# Patient Record
Sex: Female | Born: 1960 | Race: White | Hispanic: No | Marital: Married
Health system: Southern US, Community
[De-identification: ages and names within clinical notes are randomized; demographics above are authoritative.]

## PROBLEM LIST (undated history)

## (undated) HISTORY — PX: TONSILLECTOMY AND ADENOIDECTOMY: SHX28

---

## 1998-07-10 ENCOUNTER — Ambulatory Visit (HOSPITAL_COMMUNITY): Admission: RE | Admit: 1998-07-10 | Discharge: 1998-07-10 | Payer: Self-pay | Admitting: Obstetrics and Gynecology

## 1998-07-10 ENCOUNTER — Encounter: Payer: Self-pay | Admitting: Obstetrics and Gynecology

## 1999-10-02 ENCOUNTER — Encounter: Payer: Self-pay | Admitting: Emergency Medicine

## 1999-10-02 ENCOUNTER — Emergency Department (HOSPITAL_COMMUNITY): Admission: EM | Admit: 1999-10-02 | Discharge: 1999-10-02 | Payer: Self-pay | Admitting: Emergency Medicine

## 2000-01-22 ENCOUNTER — Encounter: Payer: Self-pay | Admitting: Obstetrics and Gynecology

## 2000-01-22 ENCOUNTER — Ambulatory Visit (HOSPITAL_COMMUNITY): Admission: RE | Admit: 2000-01-22 | Discharge: 2000-01-22 | Payer: Self-pay | Admitting: Obstetrics and Gynecology

## 2001-02-11 ENCOUNTER — Encounter: Payer: Self-pay | Admitting: Obstetrics and Gynecology

## 2001-02-11 ENCOUNTER — Ambulatory Visit (HOSPITAL_COMMUNITY): Admission: RE | Admit: 2001-02-11 | Discharge: 2001-02-11 | Payer: Self-pay | Admitting: Obstetrics and Gynecology

## 2001-05-03 ENCOUNTER — Other Ambulatory Visit: Admission: RE | Admit: 2001-05-03 | Discharge: 2001-05-03 | Payer: Self-pay | Admitting: *Deleted

## 2002-02-20 ENCOUNTER — Encounter: Payer: Self-pay | Admitting: Obstetrics and Gynecology

## 2002-02-20 ENCOUNTER — Ambulatory Visit (HOSPITAL_COMMUNITY): Admission: RE | Admit: 2002-02-20 | Discharge: 2002-02-20 | Payer: Self-pay | Admitting: Obstetrics and Gynecology

## 2003-01-24 ENCOUNTER — Emergency Department (HOSPITAL_COMMUNITY): Admission: EM | Admit: 2003-01-24 | Discharge: 2003-01-24 | Payer: Self-pay | Admitting: Emergency Medicine

## 2003-01-24 ENCOUNTER — Encounter: Payer: Self-pay | Admitting: Emergency Medicine

## 2003-03-24 ENCOUNTER — Ambulatory Visit (HOSPITAL_COMMUNITY): Admission: RE | Admit: 2003-03-24 | Discharge: 2003-03-24 | Payer: Self-pay | Admitting: Orthopedic Surgery

## 2003-03-24 ENCOUNTER — Encounter: Payer: Self-pay | Admitting: Orthopedic Surgery

## 2004-07-15 ENCOUNTER — Other Ambulatory Visit: Admission: RE | Admit: 2004-07-15 | Discharge: 2004-07-15 | Payer: Self-pay | Admitting: Obstetrics and Gynecology

## 2004-07-16 ENCOUNTER — Ambulatory Visit (HOSPITAL_COMMUNITY): Admission: RE | Admit: 2004-07-16 | Discharge: 2004-07-16 | Payer: Self-pay | Admitting: Obstetrics and Gynecology

## 2004-10-10 ENCOUNTER — Ambulatory Visit (HOSPITAL_COMMUNITY): Admission: RE | Admit: 2004-10-10 | Discharge: 2004-10-10 | Payer: Self-pay | Admitting: Obstetrics and Gynecology

## 2005-10-28 ENCOUNTER — Ambulatory Visit (HOSPITAL_COMMUNITY): Admission: RE | Admit: 2005-10-28 | Discharge: 2005-10-28 | Payer: Self-pay | Admitting: Internal Medicine

## 2006-12-22 ENCOUNTER — Ambulatory Visit (HOSPITAL_COMMUNITY): Admission: RE | Admit: 2006-12-22 | Discharge: 2006-12-22 | Payer: Self-pay | Admitting: Obstetrics and Gynecology

## 2008-02-08 ENCOUNTER — Ambulatory Visit (HOSPITAL_COMMUNITY): Admission: RE | Admit: 2008-02-08 | Discharge: 2008-02-08 | Payer: Self-pay | Admitting: Internal Medicine

## 2009-05-10 ENCOUNTER — Ambulatory Visit (HOSPITAL_COMMUNITY): Admission: RE | Admit: 2009-05-10 | Discharge: 2009-05-10 | Payer: Self-pay | Admitting: Obstetrics and Gynecology

## 2010-01-01 ENCOUNTER — Emergency Department (HOSPITAL_BASED_OUTPATIENT_CLINIC_OR_DEPARTMENT_OTHER): Admission: EM | Admit: 2010-01-01 | Discharge: 2010-01-01 | Payer: Self-pay | Admitting: Emergency Medicine

## 2010-01-24 ENCOUNTER — Encounter: Admission: RE | Admit: 2010-01-24 | Discharge: 2010-03-28 | Payer: Self-pay | Admitting: Orthopedic Surgery

## 2010-09-27 ENCOUNTER — Encounter: Payer: Self-pay | Admitting: Internal Medicine

## 2010-09-28 ENCOUNTER — Encounter: Payer: Self-pay | Admitting: Obstetrics and Gynecology

## 2010-10-31 ENCOUNTER — Other Ambulatory Visit (HOSPITAL_COMMUNITY): Payer: Self-pay | Admitting: Obstetrics and Gynecology

## 2010-10-31 DIAGNOSIS — Z1231 Encounter for screening mammogram for malignant neoplasm of breast: Secondary | ICD-10-CM

## 2010-11-07 ENCOUNTER — Ambulatory Visit (HOSPITAL_COMMUNITY)
Admission: RE | Admit: 2010-11-07 | Discharge: 2010-11-07 | Disposition: A | Discharge status: Home / Self Care | Payer: 59 | Source: Ambulatory Visit | Attending: Obstetrics and Gynecology | Admitting: Obstetrics and Gynecology

## 2010-11-07 DIAGNOSIS — Z1231 Encounter for screening mammogram for malignant neoplasm of breast: Secondary | ICD-10-CM | POA: Insufficient documentation

## 2010-11-25 LAB — URINALYSIS, ROUTINE W REFLEX MICROSCOPIC
Bilirubin Urine: NEGATIVE
Ketones, ur: NEGATIVE mg/dL
Nitrite: NEGATIVE
Protein, ur: NEGATIVE mg/dL
Urobilinogen, UA: 0.2 mg/dL (ref 0.0–1.0)
pH: 6 (ref 5.0–8.0)

## 2011-04-01 ENCOUNTER — Ambulatory Visit (HOSPITAL_COMMUNITY)
Admission: RE | Admit: 2011-04-01 | Discharge: 2011-04-01 | Disposition: A | Discharge status: Home / Self Care | Payer: 59 | Source: Ambulatory Visit | Attending: Family Medicine | Admitting: Family Medicine

## 2011-04-01 DIAGNOSIS — Z0189 Encounter for other specified special examinations: Secondary | ICD-10-CM | POA: Insufficient documentation

## 2011-04-01 DIAGNOSIS — Z139 Encounter for screening, unspecified: Secondary | ICD-10-CM

## 2011-04-06 ENCOUNTER — Other Ambulatory Visit (HOSPITAL_COMMUNITY): Payer: Self-pay | Admitting: Family Medicine

## 2011-04-06 DIAGNOSIS — Z139 Encounter for screening, unspecified: Secondary | ICD-10-CM

## 2012-02-02 ENCOUNTER — Other Ambulatory Visit: Payer: Self-pay | Admitting: Obstetrics and Gynecology

## 2012-02-02 DIAGNOSIS — Z1231 Encounter for screening mammogram for malignant neoplasm of breast: Secondary | ICD-10-CM

## 2012-09-13 ENCOUNTER — Ambulatory Visit (INDEPENDENT_AMBULATORY_CARE_PROVIDER_SITE_OTHER): Payer: Self-pay | Admitting: Family Medicine

## 2012-09-13 DIAGNOSIS — Z713 Dietary counseling and surveillance: Secondary | ICD-10-CM

## 2013-03-29 ENCOUNTER — Other Ambulatory Visit (HOSPITAL_COMMUNITY): Payer: Self-pay | Admitting: Obstetrics and Gynecology

## 2013-03-29 DIAGNOSIS — Z1231 Encounter for screening mammogram for malignant neoplasm of breast: Secondary | ICD-10-CM

## 2013-03-31 ENCOUNTER — Ambulatory Visit (HOSPITAL_COMMUNITY)
Admission: RE | Admit: 2013-03-31 | Discharge: 2013-03-31 | Disposition: A | Discharge status: Home / Self Care | Payer: 59 | Source: Ambulatory Visit | Attending: Obstetrics and Gynecology | Admitting: Obstetrics and Gynecology

## 2013-03-31 DIAGNOSIS — Z1231 Encounter for screening mammogram for malignant neoplasm of breast: Secondary | ICD-10-CM | POA: Insufficient documentation

## 2013-12-19 ENCOUNTER — Other Ambulatory Visit: Payer: Self-pay | Admitting: Oncology

## 2014-09-20 ENCOUNTER — Other Ambulatory Visit: Payer: Self-pay | Admitting: Internal Medicine

## 2014-09-20 DIAGNOSIS — F17200 Nicotine dependence, unspecified, uncomplicated: Secondary | ICD-10-CM

## 2014-10-29 ENCOUNTER — Other Ambulatory Visit (HOSPITAL_COMMUNITY): Payer: Self-pay | Admitting: Family Medicine

## 2014-10-29 DIAGNOSIS — Z1231 Encounter for screening mammogram for malignant neoplasm of breast: Secondary | ICD-10-CM

## 2014-11-02 ENCOUNTER — Ambulatory Visit (HOSPITAL_COMMUNITY)
Admission: RE | Admit: 2014-11-02 | Discharge: 2014-11-02 | Disposition: A | Discharge status: Home / Self Care | Payer: 59 | Source: Ambulatory Visit | Attending: Family Medicine | Admitting: Family Medicine

## 2014-11-02 DIAGNOSIS — Z1231 Encounter for screening mammogram for malignant neoplasm of breast: Secondary | ICD-10-CM | POA: Insufficient documentation

## 2015-11-21 DIAGNOSIS — H5213 Myopia, bilateral: Secondary | ICD-10-CM | POA: Diagnosis not present

## 2015-11-21 DIAGNOSIS — H524 Presbyopia: Secondary | ICD-10-CM | POA: Diagnosis not present

## 2015-11-21 DIAGNOSIS — H52223 Regular astigmatism, bilateral: Secondary | ICD-10-CM | POA: Diagnosis not present

## 2016-09-02 ENCOUNTER — Other Ambulatory Visit: Payer: Self-pay | Admitting: Family Medicine

## 2016-09-02 DIAGNOSIS — Z1231 Encounter for screening mammogram for malignant neoplasm of breast: Secondary | ICD-10-CM

## 2016-09-29 ENCOUNTER — Ambulatory Visit
Admission: RE | Admit: 2016-09-29 | Discharge: 2016-09-29 | Disposition: A | Discharge status: Home / Self Care | Payer: 59 | Source: Ambulatory Visit | Attending: Family Medicine | Admitting: Family Medicine

## 2016-09-29 DIAGNOSIS — Z1231 Encounter for screening mammogram for malignant neoplasm of breast: Secondary | ICD-10-CM | POA: Diagnosis not present

## 2016-12-29 DIAGNOSIS — J028 Acute pharyngitis due to other specified organisms: Secondary | ICD-10-CM | POA: Diagnosis not present

## 2016-12-29 DIAGNOSIS — J029 Acute pharyngitis, unspecified: Secondary | ICD-10-CM | POA: Diagnosis not present

## 2017-09-29 ENCOUNTER — Other Ambulatory Visit: Payer: Self-pay | Admitting: Family Medicine

## 2017-09-29 DIAGNOSIS — Z1231 Encounter for screening mammogram for malignant neoplasm of breast: Secondary | ICD-10-CM

## 2017-10-12 DIAGNOSIS — H52223 Regular astigmatism, bilateral: Secondary | ICD-10-CM | POA: Diagnosis not present

## 2017-10-12 DIAGNOSIS — H524 Presbyopia: Secondary | ICD-10-CM | POA: Diagnosis not present

## 2017-10-12 DIAGNOSIS — H5213 Myopia, bilateral: Secondary | ICD-10-CM | POA: Diagnosis not present

## 2017-10-22 ENCOUNTER — Ambulatory Visit
Admission: RE | Admit: 2017-10-22 | Discharge: 2017-10-22 | Disposition: A | Discharge status: Home / Self Care | Payer: 59 | Source: Ambulatory Visit | Attending: Family Medicine | Admitting: Family Medicine

## 2017-10-22 DIAGNOSIS — Z1231 Encounter for screening mammogram for malignant neoplasm of breast: Secondary | ICD-10-CM

## 2018-10-24 DIAGNOSIS — H524 Presbyopia: Secondary | ICD-10-CM | POA: Diagnosis not present

## 2018-10-24 DIAGNOSIS — H52223 Regular astigmatism, bilateral: Secondary | ICD-10-CM | POA: Diagnosis not present

## 2018-10-24 DIAGNOSIS — H5213 Myopia, bilateral: Secondary | ICD-10-CM | POA: Diagnosis not present

## 2018-10-25 DIAGNOSIS — D2371 Other benign neoplasm of skin of right lower limb, including hip: Secondary | ICD-10-CM | POA: Diagnosis not present

## 2018-10-25 DIAGNOSIS — D235 Other benign neoplasm of skin of trunk: Secondary | ICD-10-CM | POA: Diagnosis not present

## 2018-10-25 DIAGNOSIS — D2262 Melanocytic nevi of left upper limb, including shoulder: Secondary | ICD-10-CM | POA: Diagnosis not present

## 2018-10-25 DIAGNOSIS — L218 Other seborrheic dermatitis: Secondary | ICD-10-CM | POA: Diagnosis not present

## 2018-10-25 DIAGNOSIS — D485 Neoplasm of uncertain behavior of skin: Secondary | ICD-10-CM | POA: Diagnosis not present

## 2018-10-25 DIAGNOSIS — D2261 Melanocytic nevi of right upper limb, including shoulder: Secondary | ICD-10-CM | POA: Diagnosis not present

## 2018-10-25 DIAGNOSIS — D224 Melanocytic nevi of scalp and neck: Secondary | ICD-10-CM | POA: Diagnosis not present

## 2018-10-25 DIAGNOSIS — D225 Melanocytic nevi of trunk: Secondary | ICD-10-CM | POA: Diagnosis not present

## 2018-10-25 DIAGNOSIS — L814 Other melanin hyperpigmentation: Secondary | ICD-10-CM | POA: Diagnosis not present

## 2019-02-13 ENCOUNTER — Other Ambulatory Visit: Payer: Self-pay | Admitting: Family Medicine

## 2019-02-13 DIAGNOSIS — Z1231 Encounter for screening mammogram for malignant neoplasm of breast: Secondary | ICD-10-CM

## 2019-03-31 ENCOUNTER — Ambulatory Visit
Admission: RE | Admit: 2019-03-31 | Discharge: 2019-03-31 | Disposition: A | Discharge status: Home / Self Care | Payer: 59 | Source: Ambulatory Visit | Attending: Family Medicine | Admitting: Family Medicine

## 2019-03-31 ENCOUNTER — Other Ambulatory Visit: Payer: Self-pay

## 2019-03-31 DIAGNOSIS — Z1231 Encounter for screening mammogram for malignant neoplasm of breast: Secondary | ICD-10-CM

## 2019-09-26 DIAGNOSIS — M25512 Pain in left shoulder: Secondary | ICD-10-CM | POA: Diagnosis not present

## 2019-09-29 DIAGNOSIS — S46119A Strain of muscle, fascia and tendon of long head of biceps, unspecified arm, initial encounter: Secondary | ICD-10-CM | POA: Diagnosis not present

## 2019-09-29 DIAGNOSIS — S46011A Strain of muscle(s) and tendon(s) of the rotator cuff of right shoulder, initial encounter: Secondary | ICD-10-CM | POA: Diagnosis not present

## 2019-09-29 DIAGNOSIS — M79622 Pain in left upper arm: Secondary | ICD-10-CM | POA: Diagnosis not present

## 2019-09-29 DIAGNOSIS — M25512 Pain in left shoulder: Secondary | ICD-10-CM | POA: Diagnosis not present

## 2019-09-29 DIAGNOSIS — M25612 Stiffness of left shoulder, not elsewhere classified: Secondary | ICD-10-CM | POA: Diagnosis not present

## 2019-09-29 DIAGNOSIS — M6281 Muscle weakness (generalized): Secondary | ICD-10-CM | POA: Diagnosis not present

## 2019-10-04 DIAGNOSIS — M79622 Pain in left upper arm: Secondary | ICD-10-CM | POA: Diagnosis not present

## 2019-10-04 DIAGNOSIS — M6281 Muscle weakness (generalized): Secondary | ICD-10-CM | POA: Diagnosis not present

## 2019-10-04 DIAGNOSIS — M25612 Stiffness of left shoulder, not elsewhere classified: Secondary | ICD-10-CM | POA: Diagnosis not present

## 2019-10-04 DIAGNOSIS — M25512 Pain in left shoulder: Secondary | ICD-10-CM | POA: Diagnosis not present

## 2019-10-04 DIAGNOSIS — S46119A Strain of muscle, fascia and tendon of long head of biceps, unspecified arm, initial encounter: Secondary | ICD-10-CM | POA: Diagnosis not present

## 2019-10-04 DIAGNOSIS — S46011A Strain of muscle(s) and tendon(s) of the rotator cuff of right shoulder, initial encounter: Secondary | ICD-10-CM | POA: Diagnosis not present

## 2019-10-06 DIAGNOSIS — S46011A Strain of muscle(s) and tendon(s) of the rotator cuff of right shoulder, initial encounter: Secondary | ICD-10-CM | POA: Diagnosis not present

## 2019-10-06 DIAGNOSIS — M25512 Pain in left shoulder: Secondary | ICD-10-CM | POA: Diagnosis not present

## 2019-10-06 DIAGNOSIS — M6281 Muscle weakness (generalized): Secondary | ICD-10-CM | POA: Diagnosis not present

## 2019-10-06 DIAGNOSIS — M79622 Pain in left upper arm: Secondary | ICD-10-CM | POA: Diagnosis not present

## 2019-10-06 DIAGNOSIS — S46119A Strain of muscle, fascia and tendon of long head of biceps, unspecified arm, initial encounter: Secondary | ICD-10-CM | POA: Diagnosis not present

## 2019-10-06 DIAGNOSIS — M25612 Stiffness of left shoulder, not elsewhere classified: Secondary | ICD-10-CM | POA: Diagnosis not present

## 2019-10-10 DIAGNOSIS — M79622 Pain in left upper arm: Secondary | ICD-10-CM | POA: Diagnosis not present

## 2019-10-10 DIAGNOSIS — M6281 Muscle weakness (generalized): Secondary | ICD-10-CM | POA: Diagnosis not present

## 2019-10-10 DIAGNOSIS — M25512 Pain in left shoulder: Secondary | ICD-10-CM | POA: Diagnosis not present

## 2019-10-10 DIAGNOSIS — S46119A Strain of muscle, fascia and tendon of long head of biceps, unspecified arm, initial encounter: Secondary | ICD-10-CM | POA: Diagnosis not present

## 2019-10-10 DIAGNOSIS — S46011A Strain of muscle(s) and tendon(s) of the rotator cuff of right shoulder, initial encounter: Secondary | ICD-10-CM | POA: Diagnosis not present

## 2019-10-10 DIAGNOSIS — M25612 Stiffness of left shoulder, not elsewhere classified: Secondary | ICD-10-CM | POA: Diagnosis not present

## 2019-10-13 DIAGNOSIS — M25612 Stiffness of left shoulder, not elsewhere classified: Secondary | ICD-10-CM | POA: Diagnosis not present

## 2019-10-13 DIAGNOSIS — S46119A Strain of muscle, fascia and tendon of long head of biceps, unspecified arm, initial encounter: Secondary | ICD-10-CM | POA: Diagnosis not present

## 2019-10-13 DIAGNOSIS — M25512 Pain in left shoulder: Secondary | ICD-10-CM | POA: Diagnosis not present

## 2019-10-13 DIAGNOSIS — M79622 Pain in left upper arm: Secondary | ICD-10-CM | POA: Diagnosis not present

## 2019-10-13 DIAGNOSIS — M6281 Muscle weakness (generalized): Secondary | ICD-10-CM | POA: Diagnosis not present

## 2019-10-13 DIAGNOSIS — S46011A Strain of muscle(s) and tendon(s) of the rotator cuff of right shoulder, initial encounter: Secondary | ICD-10-CM | POA: Diagnosis not present

## 2019-10-17 DIAGNOSIS — S46011A Strain of muscle(s) and tendon(s) of the rotator cuff of right shoulder, initial encounter: Secondary | ICD-10-CM | POA: Diagnosis not present

## 2019-10-17 DIAGNOSIS — M79622 Pain in left upper arm: Secondary | ICD-10-CM | POA: Diagnosis not present

## 2019-10-17 DIAGNOSIS — M6281 Muscle weakness (generalized): Secondary | ICD-10-CM | POA: Diagnosis not present

## 2019-10-17 DIAGNOSIS — M25612 Stiffness of left shoulder, not elsewhere classified: Secondary | ICD-10-CM | POA: Diagnosis not present

## 2019-10-17 DIAGNOSIS — S46119A Strain of muscle, fascia and tendon of long head of biceps, unspecified arm, initial encounter: Secondary | ICD-10-CM | POA: Diagnosis not present

## 2019-10-17 DIAGNOSIS — M25512 Pain in left shoulder: Secondary | ICD-10-CM | POA: Diagnosis not present

## 2019-10-20 DIAGNOSIS — S46011A Strain of muscle(s) and tendon(s) of the rotator cuff of right shoulder, initial encounter: Secondary | ICD-10-CM | POA: Diagnosis not present

## 2019-10-20 DIAGNOSIS — M25512 Pain in left shoulder: Secondary | ICD-10-CM | POA: Diagnosis not present

## 2019-10-20 DIAGNOSIS — M79622 Pain in left upper arm: Secondary | ICD-10-CM | POA: Diagnosis not present

## 2019-10-20 DIAGNOSIS — M6281 Muscle weakness (generalized): Secondary | ICD-10-CM | POA: Diagnosis not present

## 2019-10-20 DIAGNOSIS — M25612 Stiffness of left shoulder, not elsewhere classified: Secondary | ICD-10-CM | POA: Diagnosis not present

## 2019-10-20 DIAGNOSIS — S46119A Strain of muscle, fascia and tendon of long head of biceps, unspecified arm, initial encounter: Secondary | ICD-10-CM | POA: Diagnosis not present

## 2019-10-24 DIAGNOSIS — M25612 Stiffness of left shoulder, not elsewhere classified: Secondary | ICD-10-CM | POA: Diagnosis not present

## 2019-10-24 DIAGNOSIS — M6281 Muscle weakness (generalized): Secondary | ICD-10-CM | POA: Diagnosis not present

## 2019-10-24 DIAGNOSIS — M25512 Pain in left shoulder: Secondary | ICD-10-CM | POA: Diagnosis not present

## 2019-10-24 DIAGNOSIS — S46011A Strain of muscle(s) and tendon(s) of the rotator cuff of right shoulder, initial encounter: Secondary | ICD-10-CM | POA: Diagnosis not present

## 2019-10-24 DIAGNOSIS — M79622 Pain in left upper arm: Secondary | ICD-10-CM | POA: Diagnosis not present

## 2019-10-24 DIAGNOSIS — S46119A Strain of muscle, fascia and tendon of long head of biceps, unspecified arm, initial encounter: Secondary | ICD-10-CM | POA: Diagnosis not present

## 2019-10-27 DIAGNOSIS — S46011A Strain of muscle(s) and tendon(s) of the rotator cuff of right shoulder, initial encounter: Secondary | ICD-10-CM | POA: Diagnosis not present

## 2019-10-27 DIAGNOSIS — M25512 Pain in left shoulder: Secondary | ICD-10-CM | POA: Diagnosis not present

## 2019-10-27 DIAGNOSIS — M79622 Pain in left upper arm: Secondary | ICD-10-CM | POA: Diagnosis not present

## 2019-10-27 DIAGNOSIS — S46119A Strain of muscle, fascia and tendon of long head of biceps, unspecified arm, initial encounter: Secondary | ICD-10-CM | POA: Diagnosis not present

## 2019-10-27 DIAGNOSIS — M6281 Muscle weakness (generalized): Secondary | ICD-10-CM | POA: Diagnosis not present

## 2019-10-27 DIAGNOSIS — M25612 Stiffness of left shoulder, not elsewhere classified: Secondary | ICD-10-CM | POA: Diagnosis not present

## 2019-10-31 DIAGNOSIS — M25512 Pain in left shoulder: Secondary | ICD-10-CM | POA: Diagnosis not present

## 2019-10-31 DIAGNOSIS — M6281 Muscle weakness (generalized): Secondary | ICD-10-CM | POA: Diagnosis not present

## 2019-10-31 DIAGNOSIS — M79622 Pain in left upper arm: Secondary | ICD-10-CM | POA: Diagnosis not present

## 2019-10-31 DIAGNOSIS — S46011A Strain of muscle(s) and tendon(s) of the rotator cuff of right shoulder, initial encounter: Secondary | ICD-10-CM | POA: Diagnosis not present

## 2019-10-31 DIAGNOSIS — M25612 Stiffness of left shoulder, not elsewhere classified: Secondary | ICD-10-CM | POA: Diagnosis not present

## 2019-10-31 DIAGNOSIS — S46119A Strain of muscle, fascia and tendon of long head of biceps, unspecified arm, initial encounter: Secondary | ICD-10-CM | POA: Diagnosis not present

## 2019-11-03 DIAGNOSIS — S46011A Strain of muscle(s) and tendon(s) of the rotator cuff of right shoulder, initial encounter: Secondary | ICD-10-CM | POA: Diagnosis not present

## 2019-11-03 DIAGNOSIS — S46119A Strain of muscle, fascia and tendon of long head of biceps, unspecified arm, initial encounter: Secondary | ICD-10-CM | POA: Diagnosis not present

## 2019-11-03 DIAGNOSIS — M25612 Stiffness of left shoulder, not elsewhere classified: Secondary | ICD-10-CM | POA: Diagnosis not present

## 2019-11-03 DIAGNOSIS — M79622 Pain in left upper arm: Secondary | ICD-10-CM | POA: Diagnosis not present

## 2019-11-03 DIAGNOSIS — M25512 Pain in left shoulder: Secondary | ICD-10-CM | POA: Diagnosis not present

## 2019-11-03 DIAGNOSIS — M6281 Muscle weakness (generalized): Secondary | ICD-10-CM | POA: Diagnosis not present

## 2019-11-07 DIAGNOSIS — M25612 Stiffness of left shoulder, not elsewhere classified: Secondary | ICD-10-CM | POA: Diagnosis not present

## 2019-11-07 DIAGNOSIS — M25512 Pain in left shoulder: Secondary | ICD-10-CM | POA: Diagnosis not present

## 2019-11-07 DIAGNOSIS — M6281 Muscle weakness (generalized): Secondary | ICD-10-CM | POA: Diagnosis not present

## 2019-11-07 DIAGNOSIS — S46119A Strain of muscle, fascia and tendon of long head of biceps, unspecified arm, initial encounter: Secondary | ICD-10-CM | POA: Diagnosis not present

## 2019-11-07 DIAGNOSIS — M79622 Pain in left upper arm: Secondary | ICD-10-CM | POA: Diagnosis not present

## 2019-11-07 DIAGNOSIS — S46011A Strain of muscle(s) and tendon(s) of the rotator cuff of right shoulder, initial encounter: Secondary | ICD-10-CM | POA: Diagnosis not present

## 2019-11-10 DIAGNOSIS — M25512 Pain in left shoulder: Secondary | ICD-10-CM | POA: Diagnosis not present

## 2019-11-10 DIAGNOSIS — S46011A Strain of muscle(s) and tendon(s) of the rotator cuff of right shoulder, initial encounter: Secondary | ICD-10-CM | POA: Diagnosis not present

## 2019-11-10 DIAGNOSIS — S46119A Strain of muscle, fascia and tendon of long head of biceps, unspecified arm, initial encounter: Secondary | ICD-10-CM | POA: Diagnosis not present

## 2019-11-10 DIAGNOSIS — M6281 Muscle weakness (generalized): Secondary | ICD-10-CM | POA: Diagnosis not present

## 2019-11-10 DIAGNOSIS — M25612 Stiffness of left shoulder, not elsewhere classified: Secondary | ICD-10-CM | POA: Diagnosis not present

## 2019-11-10 DIAGNOSIS — M79622 Pain in left upper arm: Secondary | ICD-10-CM | POA: Diagnosis not present

## 2019-11-13 ENCOUNTER — Ambulatory Visit: Payer: 59 | Attending: Internal Medicine

## 2019-11-13 DIAGNOSIS — Z23 Encounter for immunization: Secondary | ICD-10-CM | POA: Insufficient documentation

## 2019-11-13 NOTE — Progress Notes (Signed)
   Covid-19 Vaccination Clinic  Name:  Nancy Pena    MRN: [id number] DOB: 06/21/1961  11/13/2019  Ms. Service was observed post Covid-19 immunization for 15 minutes without incident. She was provided with Vaccine Information Sheet and instruction to access the V-Safe system.   [name] was instructed to call 911 with any severe reactions post vaccine: Marland Kitchen Difficulty breathing  . Swelling of face and throat  . A fast heartbeat  . A bad rash all over body  . Dizziness and weakness   Immunizations Administered    Name Date Dose VIS Date Route   Pfizer COVID-19 Vaccine 11/13/2019  2:48 PM 0.3 mL 08/18/2019 Intramuscular   Manufacturer: [place]   Lot: [id number]   [place]: [id number]

## 2019-11-14 DIAGNOSIS — M25612 Stiffness of left shoulder, not elsewhere classified: Secondary | ICD-10-CM | POA: Diagnosis not present

## 2019-11-14 DIAGNOSIS — S46011A Strain of muscle(s) and tendon(s) of the rotator cuff of right shoulder, initial encounter: Secondary | ICD-10-CM | POA: Diagnosis not present

## 2019-11-14 DIAGNOSIS — M79622 Pain in left upper arm: Secondary | ICD-10-CM | POA: Diagnosis not present

## 2019-11-14 DIAGNOSIS — M25512 Pain in left shoulder: Secondary | ICD-10-CM | POA: Diagnosis not present

## 2019-11-14 DIAGNOSIS — S46119A Strain of muscle, fascia and tendon of long head of biceps, unspecified arm, initial encounter: Secondary | ICD-10-CM | POA: Diagnosis not present

## 2019-11-14 DIAGNOSIS — M6281 Muscle weakness (generalized): Secondary | ICD-10-CM | POA: Diagnosis not present

## 2019-11-17 DIAGNOSIS — M79622 Pain in left upper arm: Secondary | ICD-10-CM | POA: Diagnosis not present

## 2019-11-17 DIAGNOSIS — M25612 Stiffness of left shoulder, not elsewhere classified: Secondary | ICD-10-CM | POA: Diagnosis not present

## 2019-11-17 DIAGNOSIS — M25512 Pain in left shoulder: Secondary | ICD-10-CM | POA: Diagnosis not present

## 2019-11-17 DIAGNOSIS — M6281 Muscle weakness (generalized): Secondary | ICD-10-CM | POA: Diagnosis not present

## 2019-11-17 DIAGNOSIS — S46011A Strain of muscle(s) and tendon(s) of the rotator cuff of right shoulder, initial encounter: Secondary | ICD-10-CM | POA: Diagnosis not present

## 2019-11-17 DIAGNOSIS — S46119A Strain of muscle, fascia and tendon of long head of biceps, unspecified arm, initial encounter: Secondary | ICD-10-CM | POA: Diagnosis not present

## 2019-11-24 DIAGNOSIS — M6281 Muscle weakness (generalized): Secondary | ICD-10-CM | POA: Diagnosis not present

## 2019-11-24 DIAGNOSIS — M79622 Pain in left upper arm: Secondary | ICD-10-CM | POA: Diagnosis not present

## 2019-11-24 DIAGNOSIS — M25512 Pain in left shoulder: Secondary | ICD-10-CM | POA: Diagnosis not present

## 2019-11-24 DIAGNOSIS — M25612 Stiffness of left shoulder, not elsewhere classified: Secondary | ICD-10-CM | POA: Diagnosis not present

## 2019-11-24 DIAGNOSIS — S46119A Strain of muscle, fascia and tendon of long head of biceps, unspecified arm, initial encounter: Secondary | ICD-10-CM | POA: Diagnosis not present

## 2019-11-24 DIAGNOSIS — S46011A Strain of muscle(s) and tendon(s) of the rotator cuff of right shoulder, initial encounter: Secondary | ICD-10-CM | POA: Diagnosis not present

## 2019-11-28 DIAGNOSIS — S46119A Strain of muscle, fascia and tendon of long head of biceps, unspecified arm, initial encounter: Secondary | ICD-10-CM | POA: Diagnosis not present

## 2019-11-28 DIAGNOSIS — M6281 Muscle weakness (generalized): Secondary | ICD-10-CM | POA: Diagnosis not present

## 2019-11-28 DIAGNOSIS — M25512 Pain in left shoulder: Secondary | ICD-10-CM | POA: Diagnosis not present

## 2019-11-28 DIAGNOSIS — S46011A Strain of muscle(s) and tendon(s) of the rotator cuff of right shoulder, initial encounter: Secondary | ICD-10-CM | POA: Diagnosis not present

## 2019-11-28 DIAGNOSIS — M79622 Pain in left upper arm: Secondary | ICD-10-CM | POA: Diagnosis not present

## 2019-11-28 DIAGNOSIS — M25612 Stiffness of left shoulder, not elsewhere classified: Secondary | ICD-10-CM | POA: Diagnosis not present

## 2019-12-13 ENCOUNTER — Ambulatory Visit: Payer: 59 | Attending: Internal Medicine

## 2019-12-13 DIAGNOSIS — Z23 Encounter for immunization: Secondary | ICD-10-CM

## 2019-12-13 NOTE — Progress Notes (Signed)
   Covid-19 Vaccination Clinic  Name:  Nancy Pena    MRN: [id number] DOB: [birth year]  12/13/2019  [name] was observed post Covid-19 immunization for 15 minutes without incident. She was provided with Vaccine Information Sheet and instruction to access the V-Safe system.   [name] was instructed to call 911 with any severe reactions post vaccine: Marland Kitchen Difficulty breathing  . Swelling of face and throat  . A fast heartbeat  . A bad rash all over body  . Dizziness and weakness   Immunizations Administered    Name Date Dose VIS Date Route   Pfizer COVID-19 Vaccine 12/13/2019 12:45 PM 0.3 mL 08/18/2019 Intramuscular   Manufacturer: [place]   Lot: [id number]   [place]: [id number]

## 2020-01-02 DIAGNOSIS — D225 Melanocytic nevi of trunk: Secondary | ICD-10-CM | POA: Diagnosis not present

## 2020-01-02 DIAGNOSIS — D1801 Hemangioma of skin and subcutaneous tissue: Secondary | ICD-10-CM | POA: Diagnosis not present

## 2020-01-02 DIAGNOSIS — L812 Freckles: Secondary | ICD-10-CM | POA: Diagnosis not present

## 2020-01-02 DIAGNOSIS — D2272 Melanocytic nevi of left lower limb, including hip: Secondary | ICD-10-CM | POA: Diagnosis not present

## 2020-01-02 DIAGNOSIS — L918 Other hypertrophic disorders of the skin: Secondary | ICD-10-CM | POA: Diagnosis not present

## 2020-01-02 DIAGNOSIS — L821 Other seborrheic keratosis: Secondary | ICD-10-CM | POA: Diagnosis not present

## 2020-01-02 DIAGNOSIS — D2261 Melanocytic nevi of right upper limb, including shoulder: Secondary | ICD-10-CM | POA: Diagnosis not present

## 2020-01-02 DIAGNOSIS — D2271 Melanocytic nevi of right lower limb, including hip: Secondary | ICD-10-CM | POA: Diagnosis not present

## 2020-03-26 ENCOUNTER — Other Ambulatory Visit: Payer: Self-pay | Admitting: Family Medicine

## 2020-03-26 DIAGNOSIS — Z1231 Encounter for screening mammogram for malignant neoplasm of breast: Secondary | ICD-10-CM

## 2020-04-09 ENCOUNTER — Ambulatory Visit
Admission: RE | Admit: 2020-04-09 | Discharge: 2020-04-09 | Disposition: A | Discharge status: Home / Self Care | Payer: 59 | Source: Ambulatory Visit | Attending: Family Medicine | Admitting: Family Medicine

## 2020-04-09 ENCOUNTER — Other Ambulatory Visit: Payer: Self-pay

## 2020-04-09 DIAGNOSIS — Z1231 Encounter for screening mammogram for malignant neoplasm of breast: Secondary | ICD-10-CM

## 2021-04-04 IMAGING — MG DIGITAL SCREENING BILAT W/ TOMO W/ CAD
6 of 10 series · 6 of 30 positions shown · non-contrast
Comparison: Previous exam(s).

CLINICAL DATA: Screening.

EXAM:
DIGITAL SCREENING BILATERAL MAMMOGRAM WITH TOMO AND CAD

[R MLO synth-2D]
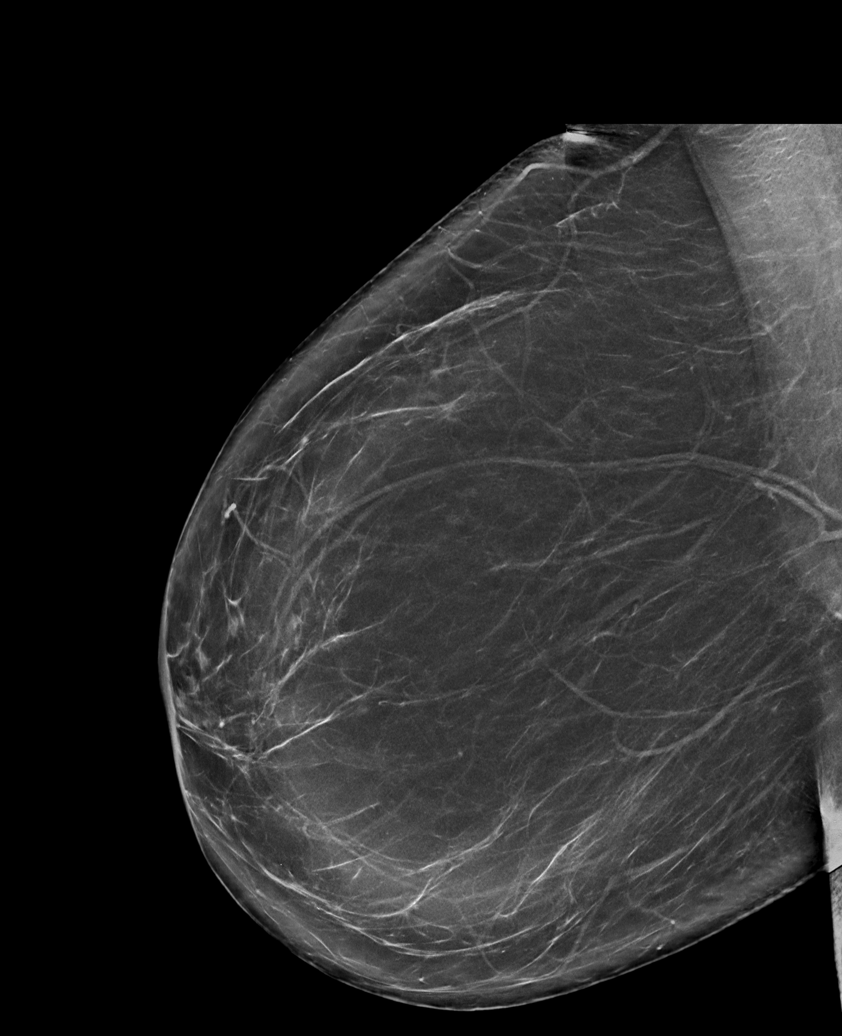

[R CC synth-2D]
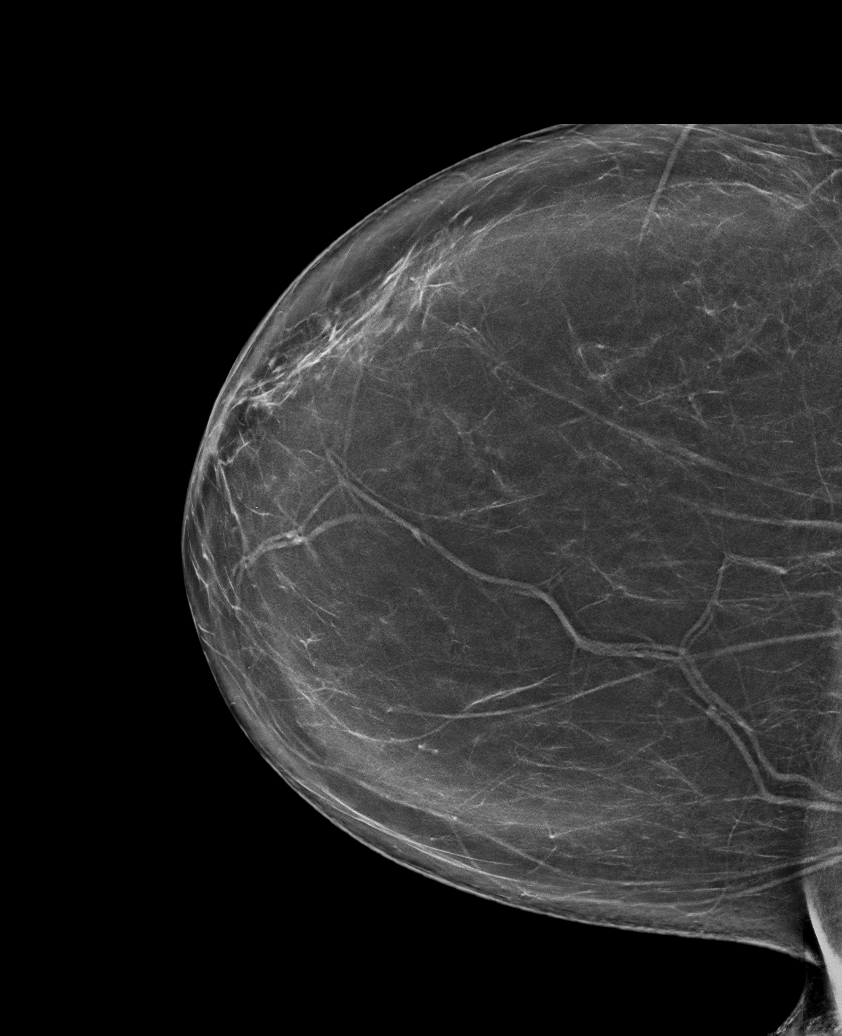

[L MLO synth-2D (1 of 2)]
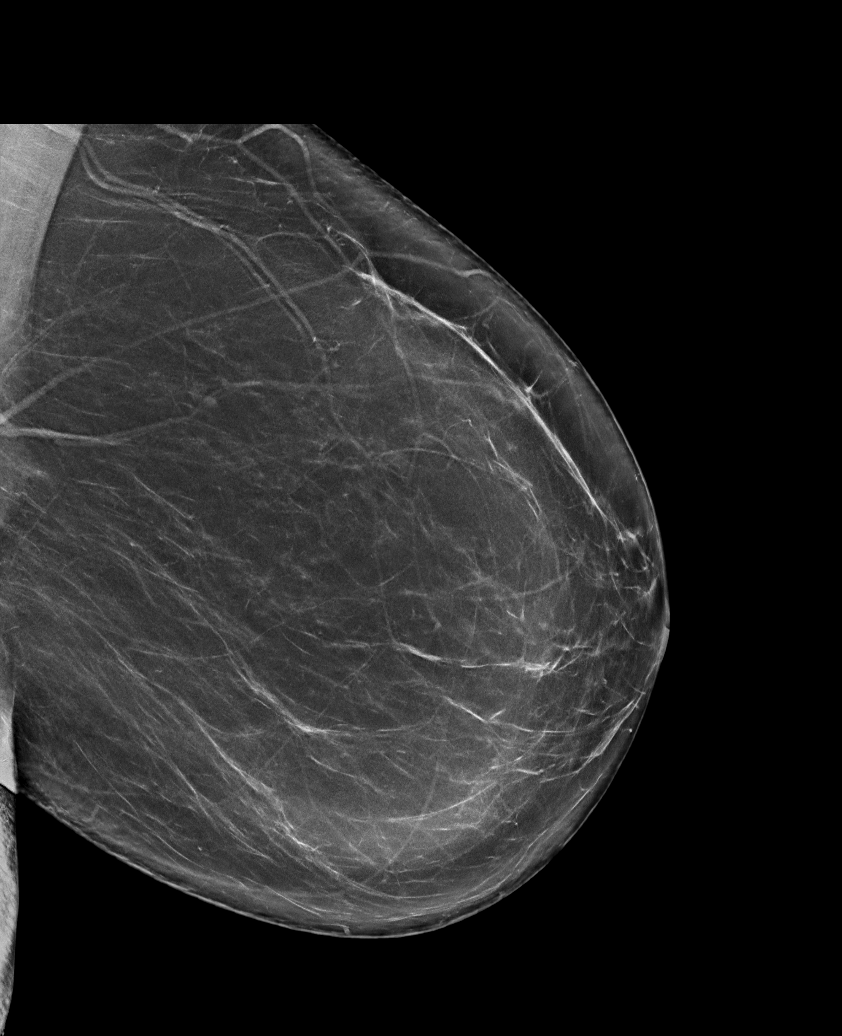

[L MLO synth-2D (2 of 2)]
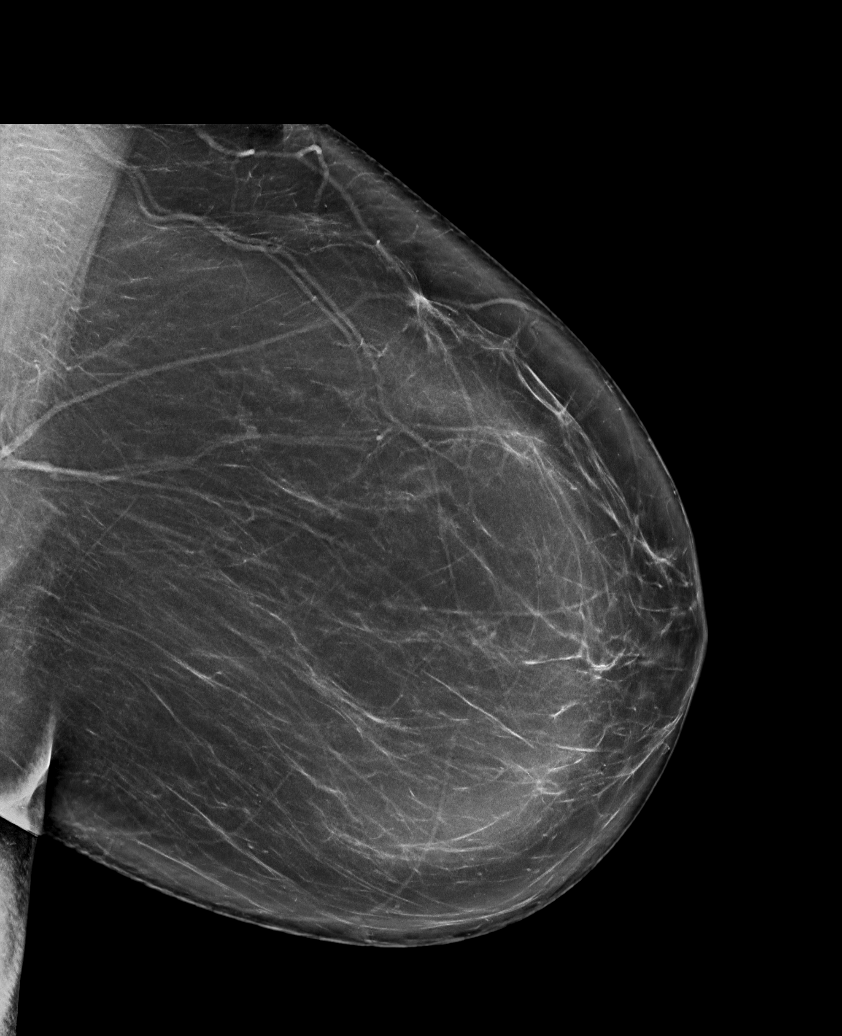

[L CC synth-2D]
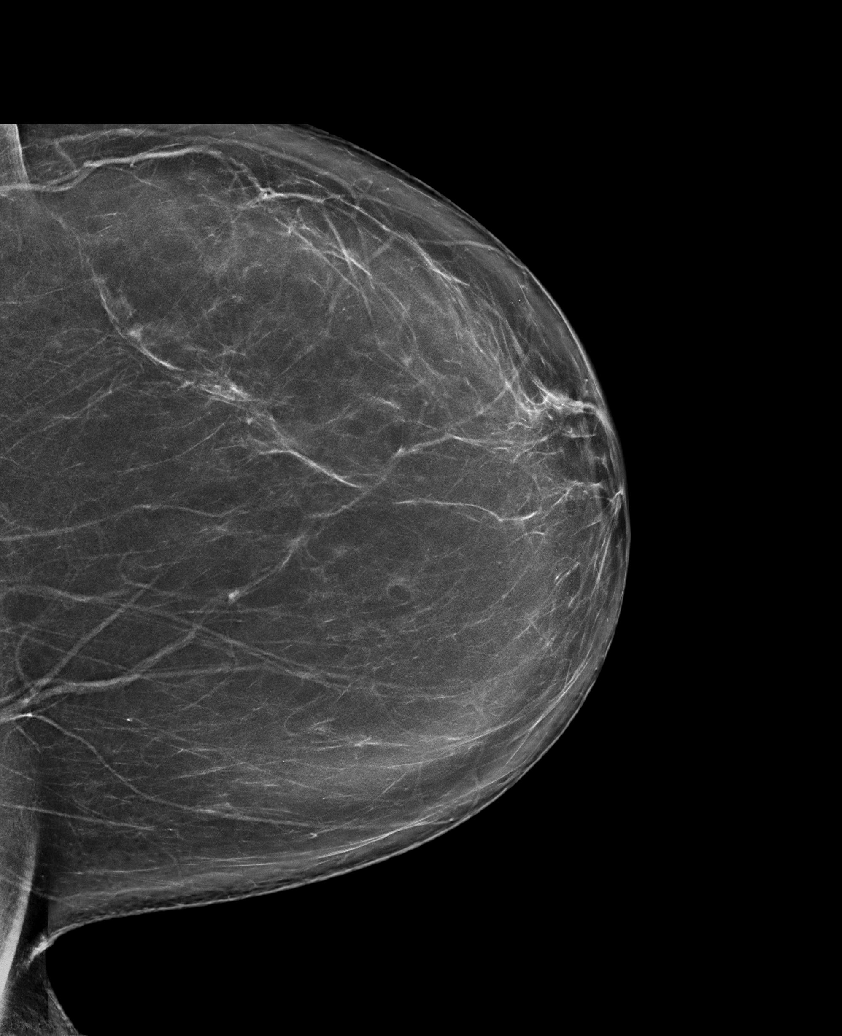

[L MLO tomo · tomo slice 45/90.0]
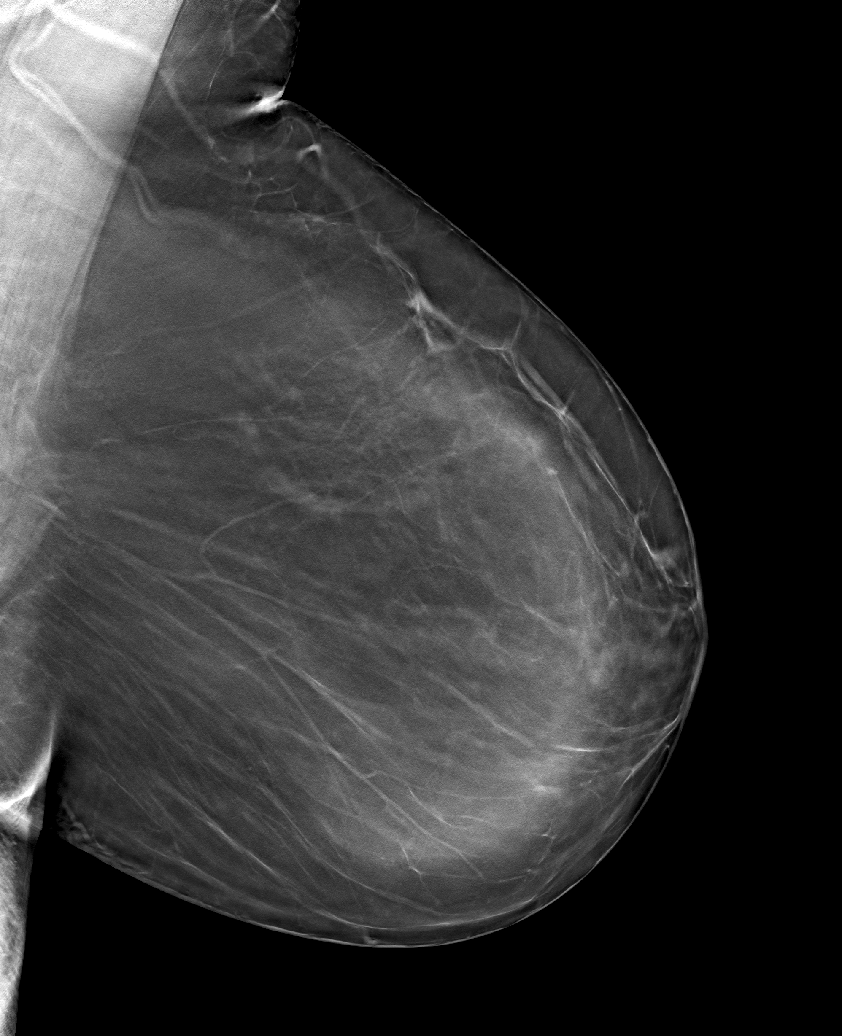

[6 of 30 positions shown; findings below may reference images not displayed]

ACR Breast Density Category b: There are scattered areas of
fibroglandular density.
FINDINGS: There are no findings suspicious for malignancy. Images were
processed with CAD.
IMPRESSION: No mammographic evidence of malignancy. A result letter of this
screening mammogram will be mailed directly to the patient.

RECOMMENDATION:
Screening mammogram in one year. (Code:[id number])

BI-RADS CATEGORY  1: Negative.

## 2021-06-05 ENCOUNTER — Other Ambulatory Visit: Payer: Self-pay | Admitting: Family Medicine

## 2021-06-05 DIAGNOSIS — Z1231 Encounter for screening mammogram for malignant neoplasm of breast: Secondary | ICD-10-CM

## 2021-06-27 ENCOUNTER — Other Ambulatory Visit: Payer: Self-pay

## 2021-06-27 ENCOUNTER — Ambulatory Visit
Admission: RE | Admit: 2021-06-27 | Discharge: 2021-06-27 | Disposition: A | Discharge status: Home / Self Care | Payer: 59 | Source: Ambulatory Visit | Attending: Family Medicine | Admitting: Family Medicine

## 2021-06-27 DIAGNOSIS — Z1231 Encounter for screening mammogram for malignant neoplasm of breast: Secondary | ICD-10-CM

## 2022-06-25 ENCOUNTER — Other Ambulatory Visit: Payer: Self-pay | Admitting: Family

## 2023-04-17 ENCOUNTER — Emergency Department (HOSPITAL_BASED_OUTPATIENT_CLINIC_OR_DEPARTMENT_OTHER): Payer: 59

## 2023-04-17 ENCOUNTER — Other Ambulatory Visit: Payer: Self-pay

## 2023-04-17 ENCOUNTER — Emergency Department (HOSPITAL_BASED_OUTPATIENT_CLINIC_OR_DEPARTMENT_OTHER): Payer: 59 | Admitting: Radiology

## 2023-04-17 ENCOUNTER — Emergency Department (HOSPITAL_BASED_OUTPATIENT_CLINIC_OR_DEPARTMENT_OTHER)
Admission: EM | Admit: 2023-04-17 | Discharge: 2023-04-17 | Disposition: A | Discharge status: Home / Self Care | Payer: 59 | Attending: Emergency Medicine | Admitting: Emergency Medicine

## 2023-04-17 ENCOUNTER — Encounter (HOSPITAL_BASED_OUTPATIENT_CLINIC_OR_DEPARTMENT_OTHER): Payer: Self-pay

## 2023-04-17 DIAGNOSIS — R61 Generalized hyperhidrosis: Secondary | ICD-10-CM | POA: Diagnosis not present

## 2023-04-17 DIAGNOSIS — R9389 Abnormal findings on diagnostic imaging of other specified body structures: Secondary | ICD-10-CM

## 2023-04-17 DIAGNOSIS — N888 Other specified noninflammatory disorders of cervix uteri: Secondary | ICD-10-CM | POA: Insufficient documentation

## 2023-04-17 DIAGNOSIS — N858 Other specified noninflammatory disorders of uterus: Secondary | ICD-10-CM | POA: Diagnosis not present

## 2023-04-17 DIAGNOSIS — R21 Rash and other nonspecific skin eruption: Secondary | ICD-10-CM | POA: Diagnosis not present

## 2023-04-17 DIAGNOSIS — R109 Unspecified abdominal pain: Secondary | ICD-10-CM | POA: Diagnosis not present

## 2023-04-17 DIAGNOSIS — R197 Diarrhea, unspecified: Secondary | ICD-10-CM | POA: Insufficient documentation

## 2023-04-17 DIAGNOSIS — R9431 Abnormal electrocardiogram [ECG] [EKG]: Secondary | ICD-10-CM | POA: Diagnosis not present

## 2023-04-17 DIAGNOSIS — K769 Liver disease, unspecified: Secondary | ICD-10-CM | POA: Diagnosis not present

## 2023-04-17 DIAGNOSIS — D259 Leiomyoma of uterus, unspecified: Secondary | ICD-10-CM | POA: Diagnosis not present

## 2023-04-17 DIAGNOSIS — R1084 Generalized abdominal pain: Secondary | ICD-10-CM | POA: Diagnosis not present

## 2023-04-17 DIAGNOSIS — R935 Abnormal findings on diagnostic imaging of other abdominal regions, including retroperitoneum: Secondary | ICD-10-CM | POA: Diagnosis not present

## 2023-04-17 DIAGNOSIS — R140 Abdominal distension (gaseous): Secondary | ICD-10-CM | POA: Insufficient documentation

## 2023-04-17 DIAGNOSIS — R5383 Other fatigue: Secondary | ICD-10-CM | POA: Diagnosis not present

## 2023-04-17 LAB — BASIC METABOLIC PANEL
Anion gap: 10 (ref 5–15)
BUN: 14 mg/dL (ref 8–23)
CO2: 24 mmol/L (ref 22–32)
Calcium: 9.4 mg/dL (ref 8.9–10.3)
Chloride: 105 mmol/L (ref 98–111)
Creatinine, Ser: 0.83 mg/dL (ref 0.44–1.00)
GFR, Estimated: >60 mL/min (ref >60)
Glucose, Bld: 108 mg/dL — ABNORMAL HIGH (ref 70–99)
Potassium: 3.7 mmol/L (ref 3.5–5.1)
Sodium: 139 mmol/L (ref 135–145)

## 2023-04-17 LAB — URINALYSIS, ROUTINE W REFLEX MICROSCOPIC
Bilirubin Urine: NEGATIVE
Glucose, UA: NEGATIVE mg/dL
Hgb urine dipstick: NEGATIVE
Ketones, ur: 15 mg/dL — AB
Nitrite: NEGATIVE
Protein, ur: 30 mg/dL — AB
Specific Gravity, Urine: 1.031 — ABNORMAL HIGH (ref 1.005–1.030)
pH: 5.5 (ref 5.0–8.0)

## 2023-04-17 LAB — HEPATIC FUNCTION PANEL
ALT: 30 U/L (ref 0–44)
AST: 31 U/L (ref 15–41)
Albumin: 4.5 g/dL (ref 3.5–5.0)
Alkaline Phosphatase: 72 U/L (ref 38–126)
Bilirubin, Direct: 0.2 mg/dL (ref 0.0–0.2)
Indirect Bilirubin: 0.7 mg/dL (ref 0.3–0.9)
Total Bilirubin: 0.9 mg/dL (ref 0.3–1.2)
Total Protein: 7.7 g/dL (ref 6.5–8.1)

## 2023-04-17 LAB — CBC
HCT: 46.6 % — ABNORMAL HIGH (ref 36.0–46.0)
Hemoglobin: 16.3 g/dL — ABNORMAL HIGH (ref 12.0–15.0)
MCH: 30.7 pg (ref 26.0–34.0)
MCHC: 35 g/dL (ref 30.0–36.0)
MCV: 87.8 fL (ref 80.0–100.0)
Platelets: 235 10*3/uL (ref 150–400)
RBC: 5.31 MIL/uL — ABNORMAL HIGH (ref 3.87–5.11)
RDW: 11.9 % (ref 11.5–15.5)
WBC: 4.4 10*3/uL (ref 4.0–10.5)
nRBC: 0 % (ref 0.0–0.2)

## 2023-04-17 LAB — CBG MONITORING, ED: Glucose-Capillary: 103 mg/dL — ABNORMAL HIGH (ref 70–99)

## 2023-04-17 MED ORDER — IOHEXOL 300 MG/ML  SOLN
100.0000 mL | Freq: Once | INTRAMUSCULAR | Status: AC | PRN
  Administered 2023-04-17: 100 mL via INTRAVENOUS

## 2023-04-17 NOTE — ED Triage Notes (Signed)
Pt presents with c/o intermittent loose stools for a couple weeks, nauseated, has redness all over skin.. Pt reports hoarseness beginning last night.

## 2023-04-17 NOTE — Discharge Instructions (Addendum)
Overall your lab work today looked very reassuring.  Your liver and kidney function were normal.  Your electrolytes were normal.  Your red blood cell count was actually a little high, maybe due to some dehydration.  Your chest x-ray was clear without any findings.  As we discussed, your abdominal CT and pelvic ultrasound shows uterine fibroids but also an abnormality of the cervix and endometrial lining.  This is going to require prompt follow-up with OB/GYN.  Please call your OB/GYN office tomorrow to schedule an outpatient follow-up appointment to discuss how to proceed.  Also touch base with your primary care doctor for further evaluation of your abdominal symptoms, rash, and hoarse voice.  Return if you have persistent vomiting, severe abdominal pain, high fever, new or worsening symptoms.

## 2023-04-17 NOTE — ED Provider Notes (Signed)
[place] EMERGENCY DEPARTMENT AT [facility] Provider Note   CSN: [id number] Arrival date & time: 04/17/23  1141     History  Chief Complaint  Patient presents with   Fatigue    Nancy Pena is a 62 y.o. female.  Patient with past medical history presents to the emergency department today for evaluation of multiple symptoms.  About 1 month ago she developed intermittent abdominal cramping and bloating associated with loose stools.  This would occur several times for a few days and then resolved.  However it has returned and become more persistent.  Stool is nonbloody and poorly formed.  She has had intermittent episodes of diaphoresis and sweating.  She has had hot flashes in the past but these resolved.  She has had some flushing and also developed a mild generalized rash.  She denies any tick bites or fevers.  She has had generalized fatigue.  She continues to perform activities, such as working in her yard, but is very tired afterwards.  Yesterday and this morning she noted that her voice had changed and has been more scratchy.  She denies cough, chest congestion or sore throat.      Home Medications Prior to Admission medications   Not on File      Allergies    Patient has no known allergies.    Review of Systems   Review of Systems  Physical Exam Updated Vital Signs BP (!) 172/100 (BP Location: Right Arm)   Pulse 83   Temp 98.9 F (37.2 C)   Resp 17   Ht 5\' 5"  (1.651 m)   Wt 97.5 kg   SpO2 99%   BMI 35.78 kg/m   Physical Exam Vitals and nursing note reviewed.  Constitutional:      General: She is not in acute distress.    Appearance: She is well-developed.  HENT:     Head: Normocephalic and atraumatic.     Right Ear: External ear normal.     Left Ear: External ear normal.     Nose: Nose normal. No congestion.     Mouth/Throat:     Mouth: Mucous membranes are moist.  Eyes:     Conjunctiva/sclera: Conjunctivae normal.  Cardiovascular:      Rate and Rhythm: Normal rate and regular rhythm.     Heart sounds: No murmur heard. Pulmonary:     Effort: No respiratory distress.     Breath sounds: No wheezing, rhonchi or rales.  Abdominal:     Palpations: Abdomen is soft.     Tenderness: There is abdominal tenderness. There is no guarding or rebound.     Comments: Mild generalized tenderness to palpation  Musculoskeletal:     Cervical back: Normal range of motion and neck supple.     Right lower leg: No edema.     Left lower leg: No edema.  Skin:    General: Skin is warm and dry.     Findings: Rash present.     Comments: Patient with a mild, pink, maculopapular rash noted on the lower extremities, upper back, coalescing more over the shoulder area, neck and upper chest.  Neurological:     General: No focal deficit present.     Mental Status: She is alert. Mental status is at baseline.     Motor: No weakness.  Psychiatric:        Mood and Affect: Mood normal.     ED Results / Procedures / Treatments   Labs (all labs  ordered are listed, but only abnormal results are displayed) Labs Reviewed  CBC - Abnormal; Notable for the following components:      Result Value   RBC 5.31 (*)    Hemoglobin 16.3 (*)    HCT 46.6 (*)    All other components within normal limits  URINALYSIS, ROUTINE W REFLEX MICROSCOPIC - Abnormal; Notable for the following components:   Specific Gravity, Urine 1.031 (*)    Ketones, ur 15 (*)    Protein, ur 30 (*)    Leukocytes,Ua SMALL (*)    Bacteria, UA RARE (*)    All other components within normal limits  CBG MONITORING, ED - Abnormal; Notable for the following components:   Glucose-Capillary 103 (*)    All other components within normal limits  BASIC METABOLIC PANEL  HEPATIC FUNCTION PANEL    EKG EKG Interpretation Date/Time:  Saturday April 17 2023 11:50:50 EDT Ventricular Rate:  86 PR Interval:  128 QRS Duration:  90 QT Interval:  346 QTC Calculation: 414 R Axis:   27  Text  Interpretation: Normal sinus rhythm Cannot rule out Anterior infarct , age undetermined Abnormal ECG No previous ECGs available Nonspecific ST and T wave abnormality No old tracing to compare Confirmed by Derwood Kaplan [phone]) on 04/17/2023 12:43:41 PM  Radiology No results found.  Procedures Procedures    Medications Ordered in ED Medications - No data to display  ED Course/ Medical Decision Making/ A&P    Patient seen and examined. History obtained directly from patient. Work-up including labs, imaging, EKG ordered in triage, if performed, were reviewed.    Labs/EKG: Independently reviewed and interpreted.  This included: CBC with mild polycythemia with hemoglobin 16.3, normal white blood cell count; BMP unremarkable; UA without compelling signs of infection.  Added hepatic function panel.  Imaging: Due to voice change and nonspecific symptoms, will check chest x-ray.  Due to long duration of abdominal symptoms, not improving and actually worsening, including bloating and generalized discomfort, after discussion with patient, will perform CT abdomen pelvis with contrast.  Medications/Fluids: Ordered: None ordered   Most recent vital signs reviewed and are as follows: BP (!) 172/100 (BP Location: Right Arm)   Pulse 83   Temp 98.9 F (37.2 C)   Resp 17   Ht 5\' 5"  (1.651 m)   Wt 97.5 kg   SpO2 99%   BMI 35.78 kg/m   Initial impression: Abdominal bloating, diarrhea, voice change and rash   Reassessment performed. Patient appears stable.  Labs personally reviewed and interpreted including: CBC demonstrating mild polycythemia otherwise normal white blood cell count; chemistry and hepatic function panel unremarkable; UA without compelling sign of infection.  Imaging personally visualized and interpreted including: Chest x-ray, agree negative.  CT scan of the abdomen pelvis, agree cervical mass possible  Reviewed pertinent lab work and imaging with patient at bedside. Questions  answered.   Plan: Will proceed with ultrasound for further evaluation.  4:33 PM Reassessment performed. Patient appears stable.  Imaging personally visualized and interpreted including: Ultrasound, agree abnormalities of the cervix as well as the endometrial lining.  Reviewed pertinent lab work and imaging with patient at bedside. Questions answered.   Most current vital signs reviewed and are as follows: BP 132/80   Pulse 68   Temp 98.9 F (37.2 C) (Oral)   Resp 15   Ht 5\' 5"  (1.651 m)   Wt 97.5 kg   SpO2 98%   BMI 35.78 kg/m   Plan: Discharge to home.  Discussed need for close PCP follow-up.  She is established with OB/GYN, although she is not sure of her primary provider is now retired.  She is going to call the office tomorrow to schedule an outpatient follow-up appointment.  Prescriptions written for: None  Other home care instructions discussed: Offered trial of Bentyl or Zofran for her abdominal symptoms, she would prefer to try small bland meals to see if this helps.  ED return instructions discussed: Fever, worsening pain, persistent vomiting  Follow-up instructions discussed: Patient encouraged to follow-up with their PCP in 3 days for further evaluation of her abdominal symptoms and rash.  Encouraged her to call her OB/GYN tomorrow for follow-up appointment for workup of the abnormalities noted on CT and ultrasound today.                                 Medical Decision Making Amount and/or Complexity of Data Reviewed Labs: ordered. Radiology: ordered.  Risk Prescription drug management.   Patient presents today with approximately 1 month of vague abdominal symptoms and cramping as well as fatigue, hoarse voice, rash.  Workup today was largely reassuring however cervical and endometrial abnormalities noted.  It is unclear if these are related to her abdominal symptoms and other systemic symptoms.  However no indications for admission to the hospital today.   Vital signs are reassuring.  Patient is tolerating orals.  She has appropriate outpatient follow-up and understands importance of follow-up.  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  The patient's vital signs, pertinent lab work and imaging were reviewed and interpreted as discussed in the ED course. Hospitalization was considered for further testing, treatments, or serial exams/observation. However as patient is well-appearing, has a stable exam, and reassuring studies today, I do not feel that they warrant admission at this time. This plan was discussed with the patient who verbalizes agreement and comfort with this plan and seems reliable and able to return to the Emergency Department with worsening or changing symptoms.          Final Clinical Impression(s) / ED Diagnoses Final diagnoses:  Abdominal bloating with cramps  Endometrial thickening on ultrasound  Mass of uterine cervix determined by ultrasound    Rx / DC Orders ED Discharge Orders     None         Renne Crigler, PA-C 04/17/23 1637    Derwood Kaplan, MD 04/18/23 1052

## 2023-05-12 DIAGNOSIS — R1909 Other intra-abdominal and pelvic swelling, mass and lump: Secondary | ICD-10-CM | POA: Diagnosis not present

## 2023-05-12 DIAGNOSIS — Z124 Encounter for screening for malignant neoplasm of cervix: Secondary | ICD-10-CM | POA: Diagnosis not present

## 2023-05-12 DIAGNOSIS — D251 Intramural leiomyoma of uterus: Secondary | ICD-10-CM | POA: Diagnosis not present

## 2023-05-12 DIAGNOSIS — R102 Pelvic and perineal pain: Secondary | ICD-10-CM | POA: Diagnosis not present

## 2023-05-12 DIAGNOSIS — R9389 Abnormal findings on diagnostic imaging of other specified body structures: Secondary | ICD-10-CM | POA: Diagnosis not present

## 2023-05-12 DIAGNOSIS — Z1151 Encounter for screening for human papillomavirus (HPV): Secondary | ICD-10-CM | POA: Diagnosis not present

## 2023-05-21 ENCOUNTER — Other Ambulatory Visit: Payer: Self-pay | Admitting: Obstetrics and Gynecology

## 2023-05-21 DIAGNOSIS — Z1231 Encounter for screening mammogram for malignant neoplasm of breast: Secondary | ICD-10-CM

## 2023-05-24 ENCOUNTER — Ambulatory Visit
Admission: RE | Admit: 2023-05-24 | Discharge: 2023-05-24 | Disposition: A | Discharge status: Home / Self Care | Payer: 59 | Source: Ambulatory Visit | Attending: Obstetrics and Gynecology | Admitting: Obstetrics and Gynecology

## 2023-05-24 DIAGNOSIS — Z1231 Encounter for screening mammogram for malignant neoplasm of breast: Secondary | ICD-10-CM | POA: Diagnosis not present

## 2023-06-08 ENCOUNTER — Encounter (HOSPITAL_BASED_OUTPATIENT_CLINIC_OR_DEPARTMENT_OTHER): Payer: Self-pay | Admitting: Obstetrics and Gynecology

## 2023-06-08 ENCOUNTER — Other Ambulatory Visit: Payer: Self-pay

## 2023-06-08 NOTE — Progress Notes (Signed)
Spoke w/ via phone for pre-op interview--- Pt Lab needs dos---- CBC per surgeon orders        Lab results------ COVID test -----patient states asymptomatic no test needed Arrive at ------- 0530 NPO after MN NO Solid Food.  Clear liquids from MN until--- 0430 Med rec completed Medications to take morning of surgery ----- N/A Diabetic medication ----- N/A Patient instructed no nail polish to be worn day of surgery Patient instructed to bring photo id and insurance card day of surgery Patient aware to have Driver (ride ) / caregiver    for 24 hours after surgery - Selena Batten Patient Special Instructions ----- Pre-Op special Instructions ----- Patient verbalized understanding of instructions that were given at this phone interview. Patient denies chest pain, sob, fever, cough at the interview.

## 2023-06-09 NOTE — Progress Notes (Signed)
Spoke with patient by phone, EKG 04-17-2023 in epic, EKG ok to use for 06-18-2023 surgery

## 2023-06-11 DIAGNOSIS — Z0189 Encounter for other specified special examinations: Secondary | ICD-10-CM | POA: Diagnosis not present

## 2023-06-11 DIAGNOSIS — Z01812 Encounter for preprocedural laboratory examination: Secondary | ICD-10-CM | POA: Diagnosis not present

## 2023-06-17 NOTE — H&P (Signed)
Nancy Pena is an 62 y.o. female.  She has had lower abdominal cramping off and on associated with loose BM since early July, got worse early August so went to ED. In ED, normal labs, but CT and pelvic u/s with uterine myomas, 21mm endometrium, and a possible mass inside cervix. She has been menopausal since [age], no bleeding since then, never on HRT. Last Pap several years ago here with [facility]-normal. Has not been sexually active recently. No F/C/N/V, normal urination, BM improved with smaller meals.  Pap at visit in September normal with neg HPV, pipelle benign inactive endometrium.    Pertinent Gynecological History: Last mammogram: normal Date: 05/2023 Last pap: normal Date: 05/2023 OB History: G0,   Menstrual History:  No LMP recorded. Patient is postmenopausal.    No past medical history on file.  Past Surgical History:  Procedure Laterality Date   TONSILLECTOMY AND ADENOIDECTOMY     At 62 years old    No family history on file.  Social History:  has no history on file for tobacco use, alcohol use, and drug use.  Allergies: No Known Allergies  No medications prior to admission.    Review of Systems  Respiratory: Negative.    Cardiovascular: Negative.     Height 5\' 5"  (1.651 m), weight 93 kg. Physical Exam Constitutional:      Appearance: Normal appearance.  Cardiovascular:     Rate and Rhythm: Normal rate and regular rhythm.     Heart sounds: Normal heart sounds. No murmur heard. Pulmonary:     Effort: Pulmonary effort is normal. No respiratory distress.     Breath sounds: Normal breath sounds. No wheezing.  Abdominal:     General: There is no distension.     Palpations: Abdomen is soft. There is no mass.     Tenderness: There is no abdominal tenderness.  Genitourinary:    General: Normal vulva.     Comments: Normal uterus No adnexal mass Cervix stenotic, copious mucus once dilated Musculoskeletal:     Cervical back: Normal range of motion and neck  supple.  Neurological:     Mental Status: She is alert.     No results found for this or any previous visit (from the past 24 hour(s)).  No results found.  Assessment/Plan:  Lower abdominal/pelvic pain, thickened endometrium, possible cervical mass.  Benign pipelle, normal Pap.  Due to concern about possible neoplasia, will admit for hysteroscopy, D&C, possible Myosure resection.  Procedure, risks, alternatives all discussed, questions answered   Zenaida Niece 06/17/2023, 6:51 PM

## 2023-06-17 NOTE — Anesthesia Preprocedure Evaluation (Addendum)
Anesthesia Evaluation  Patient identified by MRN, date of birth, ID band Patient awake    Reviewed: Allergy & Precautions, NPO status , Patient's Chart, lab work & pertinent test results  History of Anesthesia Complications Negative for: history of anesthetic complications  Airway Mallampati: I  TM Distance: >3 FB Neck ROM: Full    Dental  (+) Dental Advisory Given, Teeth Intact   Pulmonary neg pulmonary ROS   Pulmonary exam normal breath sounds clear to auscultation       Cardiovascular negative cardio ROS  Rhythm:Regular Rate:Normal     Neuro/Psych negative neurological ROS     GI/Hepatic negative GI ROS, Neg liver ROS,,,  Endo/Other  negative endocrine ROS    Renal/GU negative Renal ROS     Musculoskeletal   Abdominal  (+) + obese  Peds  Hematology negative hematology ROS (+) Lab Results      Component                Value               Date                      WBC                      4.4                 04/17/2023                HGB                      16.3 (H)            04/17/2023                HCT                      46.6 (H)            04/17/2023                MCV                      87.8                04/17/2023                PLT                      235                 04/17/2023              Anesthesia Other Findings   Reproductive/Obstetrics                             Anesthesia Physical Anesthesia Plan  ASA: 2  Anesthesia Plan: General   Post-op Pain Management: Tylenol PO (pre-op)*   Induction: Intravenous  PONV Risk Score and Plan: 3 and Ondansetron and Dexamethasone  Airway Management Planned: LMA  Additional Equipment:   Intra-op Plan:   Post-operative Plan: Extubation in OR  Informed Consent: I have reviewed the patients History and Physical, chart, labs and discussed the procedure including the risks, benefits and alternatives for the  proposed anesthesia with the patient or authorized representative who has indicated his/her understanding and acceptance.  Dental advisory given  Plan Discussed with: CRNA and Anesthesiologist  Anesthesia Plan Comments: (Risks of general anesthesia discussed including, but not limited to, sore throat, hoarse voice, chipped/damaged teeth, injury to vocal cords, nausea and vomiting, allergic reactions, lung infection, heart attack, stroke, and death. All questions answered. )        Anesthesia Quick Evaluation

## 2023-06-18 ENCOUNTER — Encounter (HOSPITAL_BASED_OUTPATIENT_CLINIC_OR_DEPARTMENT_OTHER)
Admission: RE | Disposition: A | Discharge status: Home / Self Care | Payer: Self-pay | Source: Home / Self Care | Attending: Obstetrics and Gynecology

## 2023-06-18 ENCOUNTER — Ambulatory Visit (HOSPITAL_BASED_OUTPATIENT_CLINIC_OR_DEPARTMENT_OTHER): Payer: 59 | Admitting: Anesthesiology

## 2023-06-18 ENCOUNTER — Other Ambulatory Visit: Payer: Self-pay

## 2023-06-18 ENCOUNTER — Encounter (HOSPITAL_BASED_OUTPATIENT_CLINIC_OR_DEPARTMENT_OTHER): Payer: Self-pay | Admitting: Obstetrics and Gynecology

## 2023-06-18 ENCOUNTER — Ambulatory Visit (HOSPITAL_BASED_OUTPATIENT_CLINIC_OR_DEPARTMENT_OTHER)
Admission: RE | Admit: 2023-06-18 | Discharge: 2023-06-18 | Disposition: A | Discharge status: Home / Self Care | Payer: 59 | Attending: Obstetrics and Gynecology | Admitting: Obstetrics and Gynecology

## 2023-06-18 DIAGNOSIS — N840 Polyp of corpus uteri: Secondary | ICD-10-CM | POA: Diagnosis not present

## 2023-06-18 DIAGNOSIS — N841 Polyp of cervix uteri: Secondary | ICD-10-CM | POA: Insufficient documentation

## 2023-06-18 DIAGNOSIS — R9389 Abnormal findings on diagnostic imaging of other specified body structures: Secondary | ICD-10-CM | POA: Insufficient documentation

## 2023-06-18 DIAGNOSIS — K317 Polyp of stomach and duodenum: Secondary | ICD-10-CM | POA: Diagnosis not present

## 2023-06-18 DIAGNOSIS — Z01818 Encounter for other preprocedural examination: Secondary | ICD-10-CM

## 2023-06-18 DIAGNOSIS — D251 Intramural leiomyoma of uterus: Secondary | ICD-10-CM | POA: Diagnosis not present

## 2023-06-18 DIAGNOSIS — I4891 Unspecified atrial fibrillation: Secondary | ICD-10-CM | POA: Diagnosis not present

## 2023-06-18 HISTORY — PX: DILATATION & CURETTAGE/HYSTEROSCOPY WITH MYOSURE: SHX6511

## 2023-06-18 LAB — CBC
HCT: 44.4 % (ref 36.0–46.0)
Hemoglobin: 15.3 g/dL — ABNORMAL HIGH (ref 12.0–15.0)
MCH: 31.2 pg (ref 26.0–34.0)
MCHC: 34.5 g/dL (ref 30.0–36.0)
MCV: 90.4 fL (ref 80.0–100.0)
Platelets: 253 10*3/uL (ref 150–400)
RBC: 4.91 MIL/uL (ref 3.87–5.11)
RDW: 12.3 % (ref 11.5–15.5)
WBC: 5.4 10*3/uL (ref 4.0–10.5)
nRBC: 0 % (ref 0.0–0.2)

## 2023-06-18 SURGERY — DILATATION & CURETTAGE/HYSTEROSCOPY WITH MYOSURE
Anesthesia: General | Site: Vagina

## 2023-06-18 MED ORDER — MIDAZOLAM HCL 2 MG/2ML IJ SOLN
INTRAMUSCULAR | Status: AC
  Filled 2023-06-18: qty 2

## 2023-06-18 MED ORDER — PROPOFOL 500 MG/50ML IV EMUL
INTRAVENOUS | Status: AC
  Filled 2023-06-18: qty 50

## 2023-06-18 MED ORDER — ONDANSETRON HCL 4 MG/2ML IJ SOLN
INTRAMUSCULAR | Status: AC
  Filled 2023-06-18: qty 2

## 2023-06-18 MED ORDER — ACETAMINOPHEN 500 MG PO TABS
1000.0000 mg | ORAL_TABLET | Freq: Once | ORAL | Status: AC
  Administered 2023-06-18: 1000 mg via ORAL

## 2023-06-18 MED ORDER — MIDAZOLAM HCL 5 MG/5ML IJ SOLN
INTRAMUSCULAR | Status: DC | PRN
  Administered 2023-06-18: 2 mg via INTRAVENOUS

## 2023-06-18 MED ORDER — FENTANYL CITRATE (PF) 100 MCG/2ML IJ SOLN: 25.0000 ug | INTRAMUSCULAR | Status: DC | PRN

## 2023-06-18 MED ORDER — OXYCODONE HCL 5 MG PO TABS: 5.0000 mg | ORAL_TABLET | Freq: Once | ORAL | Status: DC | PRN

## 2023-06-18 MED ORDER — ACETAMINOPHEN 500 MG PO TABS
ORAL_TABLET | ORAL | Status: AC
  Filled 2023-06-18: qty 2

## 2023-06-18 MED ORDER — LIDOCAINE 2% (20 MG/ML) 5 ML SYRINGE
INTRAMUSCULAR | Status: DC | PRN
  Administered 2023-06-18: 100 mg via INTRAVENOUS

## 2023-06-18 MED ORDER — KETOROLAC TROMETHAMINE 30 MG/ML IJ SOLN
INTRAMUSCULAR | Status: DC | PRN
  Administered 2023-06-18: 30 mg via INTRAVENOUS

## 2023-06-18 MED ORDER — DEXMEDETOMIDINE HCL IN NACL 80 MCG/20ML IV SOLN
INTRAVENOUS | Status: DC | PRN
  Administered 2023-06-18: 12 ug via INTRAVENOUS

## 2023-06-18 MED ORDER — PROPOFOL 10 MG/ML IV BOLUS
INTRAVENOUS | Status: DC | PRN
  Administered 2023-06-18: 50 mg via INTRAVENOUS
  Administered 2023-06-18: 150 mg via INTRAVENOUS

## 2023-06-18 MED ORDER — LACTATED RINGERS IV SOLN: INTRAVENOUS | Status: DC

## 2023-06-18 MED ORDER — SODIUM CHLORIDE 0.9 % IR SOLN
Status: DC | PRN
  Administered 2023-06-18: 3000 mL

## 2023-06-18 MED ORDER — DEXAMETHASONE SODIUM PHOSPHATE 10 MG/ML IJ SOLN
INTRAMUSCULAR | Status: DC | PRN
  Administered 2023-06-18: 10 mg via INTRAVENOUS

## 2023-06-18 MED ORDER — DEXMEDETOMIDINE HCL IN NACL 80 MCG/20ML IV SOLN
INTRAVENOUS | Status: AC
  Filled 2023-06-18: qty 20

## 2023-06-18 MED ORDER — FENTANYL CITRATE (PF) 100 MCG/2ML IJ SOLN
INTRAMUSCULAR | Status: DC | PRN
  Administered 2023-06-18 (×2): 50 ug via INTRAVENOUS

## 2023-06-18 MED ORDER — LIDOCAINE HCL (PF) 2 % IJ SOLN
INTRAMUSCULAR | Status: AC
  Filled 2023-06-18: qty 5

## 2023-06-18 MED ORDER — OXYCODONE HCL 5 MG/5ML PO SOLN: 5.0000 mg | Freq: Once | ORAL | Status: DC | PRN

## 2023-06-18 MED ORDER — KETOROLAC TROMETHAMINE 30 MG/ML IJ SOLN
INTRAMUSCULAR | Status: AC
  Filled 2023-06-18: qty 1

## 2023-06-18 MED ORDER — ONDANSETRON HCL 4 MG/2ML IJ SOLN
INTRAMUSCULAR | Status: DC | PRN
  Administered 2023-06-18: 4 mg via INTRAVENOUS

## 2023-06-18 MED ORDER — LIDOCAINE HCL 2 % IJ SOLN
INTRAMUSCULAR | Status: DC | PRN
  Administered 2023-06-18: 16 mL

## 2023-06-18 MED ORDER — FENTANYL CITRATE (PF) 100 MCG/2ML IJ SOLN
INTRAMUSCULAR | Status: AC
  Filled 2023-06-18: qty 2

## 2023-06-18 MED ORDER — DEXAMETHASONE SODIUM PHOSPHATE 10 MG/ML IJ SOLN
INTRAMUSCULAR | Status: AC
  Filled 2023-06-18: qty 1

## 2023-06-18 SURGICAL SUPPLY — 24 items
CATH ROBINSON RED A/P 16FR (CATHETERS) ×1 IMPLANT
DEVICE MYOSURE LITE (MISCELLANEOUS) IMPLANT
DEVICE MYOSURE REACH (MISCELLANEOUS) IMPLANT
DILATOR CANAL MILEX (MISCELLANEOUS) IMPLANT
DRSG TELFA 3X8 NADH STRL (GAUZE/BANDAGES/DRESSINGS) ×1 IMPLANT
ELECT REM PT RETURN 9FT ADLT (ELECTROSURGICAL)
ELECTRODE REM PT RTRN 9FT ADLT (ELECTROSURGICAL) IMPLANT
GAUZE 4X4 16PLY ~~LOC~~+RFID DBL (SPONGE) ×2 IMPLANT
GLOVE ORTHO TXT STRL SZ7.5 (GLOVE) ×1 IMPLANT
GOWN STRL REUS W/TWL LRG LVL3 (GOWN DISPOSABLE) ×1 IMPLANT
IV NS IRRIG 3000ML ARTHROMATIC (IV SOLUTION) ×2 IMPLANT
KIT PROCEDURE FLUENT (KITS) ×1 IMPLANT
KIT TURNOVER CYSTO (KITS) ×1 IMPLANT
LOOP CUTTING BIPOLAR 21FR (ELECTRODE) IMPLANT
MYOSURE XL FIBROID (MISCELLANEOUS)
PACK VAGINAL MINOR WOMEN LF (CUSTOM PROCEDURE TRAY) ×1 IMPLANT
PAD OB MATERNITY 4.3X12.25 (PERSONAL CARE ITEMS) ×1 IMPLANT
PAD PREP 24X48 CUFFED NSTRL (MISCELLANEOUS) ×1 IMPLANT
SEAL CERVICAL OMNI LOK (ABLATOR) IMPLANT
SEAL ROD LENS SCOPE MYOSURE (ABLATOR) ×1 IMPLANT
SLEEVE SCD COMPRESS KNEE MED (STOCKING) ×1 IMPLANT
SYSTEM TISS REMOVAL MYOSURE XL (MISCELLANEOUS) IMPLANT
TOWEL OR 17X24 6PK STRL BLUE (TOWEL DISPOSABLE) ×1 IMPLANT
WATER STERILE IRR 500ML POUR (IV SOLUTION) ×1 IMPLANT

## 2023-06-18 NOTE — Anesthesia Procedure Notes (Signed)
Procedure Name: LMA Insertion Date/Time: 06/18/2023 7:31 AM  Performed by: Briant Sites, CRNAPre-anesthesia Checklist: Patient identified, Emergency Drugs available, Suction available and Patient being monitored Patient Re-evaluated:Patient Re-evaluated prior to induction Oxygen Delivery Method: Circle system utilized Preoxygenation: Pre-oxygenation with 100% oxygen Induction Type: IV induction Ventilation: Mask ventilation without difficulty LMA: LMA inserted LMA Size: 4.0 Number of attempts: 1 Airway Equipment and Method: Bite block Placement Confirmation: positive ETCO2 Tube secured with: Tape Dental Injury: Teeth and Oropharynx as per pre-operative assessment

## 2023-06-18 NOTE — Op Note (Signed)
Preoperative diagnosis: Thickened endometrium, possible cervical mass Postoperative diagnosis: Endometrial and endocervical polyps Procedure: Hysteroscopy with Myosure resection of polyps Surgeon: Lavina Hamman M.D. Anesthesia: Gen, paracervical block Findings: She had a normal endometrial cavity with a large polyp arising from 1-2:00, a smaller endocervical polyp arising from 4-5:00 Estimated blood loss: 10 Fluid deficit: Through the hysteroscope fluid deficit was about 205 cc Specimens: Endometrial resection sent for routine pathology Complications: None  Procedure in detail: The patient was taken to the operating room and placed in the dorsosupine position. General anesthesia was induced. She was placed in mobile stirrups and legs were elevated. Perineum and vagina were prepped and draped in the usual sterile fashion. A Graves speculum was inserted in the vagina and the anterior lip of the cervix was grasped with a single-tooth tenaculum. Paracervical block was performed with a total of 16 cc of 2% plain lidocaine. The uterus then sounded to 9 cm. The cervix was gradually dilated to a size 23 dilator with some difficulty initially. The Myosure hysteroscope was inserted and good visualization was achieved.  The large polyp was easily identified.  The Myosure Reach was inserted and the polyp was completely resected.  The endometrial cavity was now normal, no other visible lesions.  As I slowly withdrew the scope, there was also a small endocervical polyp identified.  This was also completely resected with the Myosure. Careful inspection revealed no further lesions or abnormalities. The hysteroscope was removed. The single-tooth tenaculum was removed from the cervix and bleeding was controlled with pressure. All instruments were then removed from the vagina. The patient tolerated the procedure well and was taken to the recovery in stable condition. Counts were correct and she had PAS hose on throughout  the procedure.

## 2023-06-18 NOTE — Discharge Instructions (Addendum)
Routine instructions for hysteroscopy OTC Tylenol and/or ibuprofen prn pain  DO NOT take Motrin until after 1:45p today. Do NOT take Tylenol until after 12p today.  Post Anesthesia Home Care Instructions  Activity: Get plenty of rest for the remainder of the day. A responsible adult should stay with you for 24 hours following the procedure.  For the next 24 hours, DO NOT: -Drive a car -Advertising copywriter -Drink alcoholic beverages -Take any medication unless instructed by your physician -Make any legal decisions or sign important papers.  Meals: Start with liquid foods such as gelatin or soup. Progress to regular foods as tolerated. Avoid greasy, spicy, heavy foods. If nausea and/or vomiting occur, drink only clear liquids until the nausea and/or vomiting subsides. Call your physician if vomiting continues.  Special Instructions/Symptoms: Your throat may feel dry or sore from the anesthesia or the breathing tube placed in your throat during surgery. If this causes discomfort, gargle with warm salt water. The discomfort should disappear within 24 hours.

## 2023-06-18 NOTE — Interval H&P Note (Signed)
History and Physical Interval Note:  06/18/2023 7:14 AM  Nancy Pena  has presented today for surgery, with the diagnosis of thickened endometrium, cervical mass.  The various methods of treatment have been discussed with the patient and family. After consideration of risks, benefits and other options for treatment, the patient has consented to  Procedure(s): DILATATION & CURETTAGE/HYSTEROSCOPY WITH MYOSURE (N/A) as a surgical intervention.  The patient's history has been reviewed, patient examined, no change in status, stable for surgery.  I have reviewed the patient's chart and labs.  Questions were answered to the patient's satisfaction.     Leighton Roach Erendida Wrenn

## 2023-06-18 NOTE — Transfer of Care (Signed)
Immediate Anesthesia Transfer of Care Note  Patient: Nancy Pena  Procedure(s) Performed: DILATATION & CURETTAGE/HYSTEROSCOPY WITH MYOSURE (Vagina )  Patient Location: PACU  Anesthesia Type:General  Level of Consciousness: sedated  Airway & Oxygen Therapy: Patient Spontanous Breathing and Patient connected to nasal cannula oxygen  Post-op Assessment: Report given to RN  Post vital signs: Reviewed and stable  Last Vitals:  Vitals Value Taken Time  BP 126/73 06/18/23 0811  Temp    Pulse 69 06/18/23 0812  Resp 17 06/18/23 0812  SpO2 97 % 06/18/23 0812  Vitals shown include unfiled device data.  Last Pain:  Vitals:   06/18/23 0602  TempSrc: Oral  PainSc: 0-No pain      Patients Stated Pain Goal: 5 (06/18/23 0602)  Complications: No notable events documented.

## 2023-06-18 NOTE — Anesthesia Postprocedure Evaluation (Signed)
Anesthesia Post Note  Patient: Nancy Pena  Procedure(s) Performed: DILATATION & CURETTAGE/HYSTEROSCOPY WITH MYOSURE (Vagina )     Patient location during evaluation: PACU Anesthesia Type: General Level of consciousness: awake Pain management: pain level controlled Vital Signs Assessment: post-procedure vital signs reviewed and stable Respiratory status: spontaneous breathing, nonlabored ventilation and respiratory function stable Cardiovascular status: blood pressure returned to baseline and stable Postop Assessment: no apparent nausea or vomiting Anesthetic complications: no   No notable events documented.  Last Vitals:  Vitals:   06/18/23 0602 06/18/23 0811  BP: (!) 169/89 126/73  Pulse: 81 71  Resp: 17 19  Temp: 36.6 C (!) 36.2 C  SpO2: 99% 96%    Last Pain:  Vitals:   06/18/23 0811  TempSrc:   PainSc: Asleep                 Linton Rump

## 2023-06-21 ENCOUNTER — Encounter (HOSPITAL_BASED_OUTPATIENT_CLINIC_OR_DEPARTMENT_OTHER): Payer: Self-pay | Admitting: Obstetrics and Gynecology

## 2023-06-21 LAB — SURGICAL PATHOLOGY

## 2023-07-16 ENCOUNTER — Other Ambulatory Visit: Payer: Self-pay | Admitting: Medical Genetics

## 2023-07-16 DIAGNOSIS — Z006 Encounter for examination for normal comparison and control in clinical research program: Secondary | ICD-10-CM

## 2023-07-27 ENCOUNTER — Other Ambulatory Visit (HOSPITAL_BASED_OUTPATIENT_CLINIC_OR_DEPARTMENT_OTHER): Payer: Self-pay | Admitting: Family Medicine

## 2023-07-27 DIAGNOSIS — M8580 Other specified disorders of bone density and structure, unspecified site: Secondary | ICD-10-CM

## 2023-08-03 DIAGNOSIS — K5730 Diverticulosis of large intestine without perforation or abscess without bleeding: Secondary | ICD-10-CM | POA: Diagnosis not present

## 2023-08-03 DIAGNOSIS — Z1211 Encounter for screening for malignant neoplasm of colon: Secondary | ICD-10-CM | POA: Diagnosis not present

## 2023-08-19 ENCOUNTER — Ambulatory Visit (HOSPITAL_BASED_OUTPATIENT_CLINIC_OR_DEPARTMENT_OTHER)
Admission: RE | Admit: 2023-08-19 | Discharge: 2023-08-19 | Disposition: A | Discharge status: Home / Self Care | Payer: 59 | Source: Ambulatory Visit | Attending: Family Medicine | Admitting: Family Medicine

## 2023-08-19 DIAGNOSIS — M8580 Other specified disorders of bone density and structure, unspecified site: Secondary | ICD-10-CM | POA: Insufficient documentation

## 2023-08-19 DIAGNOSIS — M85851 Other specified disorders of bone density and structure, right thigh: Secondary | ICD-10-CM | POA: Diagnosis not present

## 2023-08-19 DIAGNOSIS — Z780 Asymptomatic menopausal state: Secondary | ICD-10-CM | POA: Diagnosis not present

## 2023-09-23 ENCOUNTER — Other Ambulatory Visit (HOSPITAL_BASED_OUTPATIENT_CLINIC_OR_DEPARTMENT_OTHER): Payer: Self-pay

## 2023-09-23 ENCOUNTER — Other Ambulatory Visit: Payer: Self-pay

## 2023-09-23 DIAGNOSIS — Z23 Encounter for immunization: Secondary | ICD-10-CM | POA: Diagnosis not present

## 2023-09-23 DIAGNOSIS — Z0000 Encounter for general adult medical examination without abnormal findings: Secondary | ICD-10-CM | POA: Diagnosis not present

## 2023-09-23 DIAGNOSIS — N951 Menopausal and female climacteric states: Secondary | ICD-10-CM | POA: Diagnosis not present

## 2023-09-23 DIAGNOSIS — M8580 Other specified disorders of bone density and structure, unspecified site: Secondary | ICD-10-CM | POA: Diagnosis not present

## 2023-09-23 DIAGNOSIS — F411 Generalized anxiety disorder: Secondary | ICD-10-CM | POA: Diagnosis not present

## 2023-09-23 DIAGNOSIS — E782 Mixed hyperlipidemia: Secondary | ICD-10-CM | POA: Diagnosis not present

## 2023-09-23 MED ORDER — CITALOPRAM HYDROBROMIDE 20 MG PO TABS
20.0000 mg | ORAL_TABLET | Freq: Every day | ORAL | 2 refills | Status: DC
  Filled 2023-09-23: qty 30, 30d supply, fill #0

## 2023-09-24 ENCOUNTER — Other Ambulatory Visit: Payer: Self-pay

## 2023-09-28 ENCOUNTER — Other Ambulatory Visit (HOSPITAL_COMMUNITY): Payer: Self-pay

## 2023-12-07 ENCOUNTER — Other Ambulatory Visit (HOSPITAL_COMMUNITY)

## 2023-12-08 ENCOUNTER — Other Ambulatory Visit (HOSPITAL_COMMUNITY)
Admission: RE | Admit: 2023-12-08 | Discharge: 2023-12-08 | Disposition: A | Discharge status: Home / Self Care | Payer: Self-pay | Source: Ambulatory Visit | Attending: Medical Genetics | Admitting: Medical Genetics

## 2023-12-08 DIAGNOSIS — Z006 Encounter for examination for normal comparison and control in clinical research program: Secondary | ICD-10-CM | POA: Insufficient documentation

## 2023-12-21 LAB — GENECONNECT MOLECULAR SCREEN: Genetic Analysis Overall Interpretation: NEGATIVE

## 2024-02-03 ENCOUNTER — Other Ambulatory Visit (HOSPITAL_BASED_OUTPATIENT_CLINIC_OR_DEPARTMENT_OTHER): Payer: Self-pay

## 2024-03-27 DIAGNOSIS — E782 Mixed hyperlipidemia: Secondary | ICD-10-CM | POA: Diagnosis not present

## 2024-03-27 DIAGNOSIS — N951 Menopausal and female climacteric states: Secondary | ICD-10-CM | POA: Diagnosis not present

## 2024-03-27 DIAGNOSIS — F411 Generalized anxiety disorder: Secondary | ICD-10-CM | POA: Diagnosis not present

## 2024-03-27 DIAGNOSIS — Z6837 Body mass index (BMI) 37.0-37.9, adult: Secondary | ICD-10-CM | POA: Diagnosis not present

## 2024-04-12 DIAGNOSIS — N951 Menopausal and female climacteric states: Secondary | ICD-10-CM | POA: Diagnosis not present

## 2024-04-12 DIAGNOSIS — E782 Mixed hyperlipidemia: Secondary | ICD-10-CM | POA: Diagnosis not present

## 2024-05-01 ENCOUNTER — Other Ambulatory Visit: Payer: Self-pay | Admitting: Obstetrics and Gynecology

## 2024-05-01 DIAGNOSIS — Z1231 Encounter for screening mammogram for malignant neoplasm of breast: Secondary | ICD-10-CM

## 2024-05-25 ENCOUNTER — Ambulatory Visit
Admission: RE | Admit: 2024-05-25 | Discharge: 2024-05-25 | Disposition: A | Discharge status: Home / Self Care | Source: Ambulatory Visit | Attending: Obstetrics and Gynecology | Admitting: Obstetrics and Gynecology

## 2024-05-25 DIAGNOSIS — Z1231 Encounter for screening mammogram for malignant neoplasm of breast: Secondary | ICD-10-CM | POA: Diagnosis not present

## 2024-06-01 DIAGNOSIS — Z01419 Encounter for gynecological examination (general) (routine) without abnormal findings: Secondary | ICD-10-CM | POA: Diagnosis not present

## 2024-06-01 DIAGNOSIS — R6889 Other general symptoms and signs: Secondary | ICD-10-CM | POA: Diagnosis not present
